# Patient Record
Sex: Male | Born: 1985 | Race: White | Hispanic: No | Marital: Married | Smoking: Current some day smoker
Health system: Southern US, Community
[De-identification: ages and names within clinical notes are randomized; demographics above are authoritative.]

## PROBLEM LIST (undated history)

## (undated) DIAGNOSIS — I10 Essential (primary) hypertension: Secondary | ICD-10-CM

## (undated) HISTORY — DX: Essential (primary) hypertension: I10

---

## 2007-10-10 ENCOUNTER — Emergency Department: Payer: Self-pay | Admitting: Unknown Physician Specialty

## 2012-03-24 ENCOUNTER — Ambulatory Visit: Payer: Self-pay | Admitting: Internal Medicine

## 2012-03-25 ENCOUNTER — Emergency Department: Payer: Self-pay | Admitting: Emergency Medicine

## 2012-12-21 ENCOUNTER — Ambulatory Visit: Payer: Self-pay | Admitting: Family Medicine

## 2012-12-21 VITALS — BP 150/90 | HR 93 | Temp 98.1°F | Resp 16 | Ht 69.75 in | Wt 192.0 lb

## 2012-12-21 DIAGNOSIS — R030 Elevated blood-pressure reading, without diagnosis of hypertension: Secondary | ICD-10-CM

## 2012-12-21 DIAGNOSIS — Z720 Tobacco use: Secondary | ICD-10-CM

## 2012-12-21 NOTE — Patient Instructions (Signed)
Your blood pressure is satisfactory to do your test- however, if it is running higher than 120/ 80 on a consistent basis we need to consider starting BP treatment.  Please check your BP at a drug store a few times over the next month and let us (or the doctor of your choice) know if you are running high.   Cigarettes and caffeine can also increase your blood pressure- try to quit smoking!

## 2012-12-21 NOTE — Progress Notes (Signed)
[facility] [address], [place] [address] [phone]  Date:  12/21/2012   Name:  Chad Bryan   DOB:  [birth year]   MRN:  [id number]  PCP:  Elvina Sidle, MD    Chief Complaint: BLOOD PRESSURE   History of Present Illness:  Chad Bryan is a 27 y.o. very pleasant male patient who presents with the following:  He is here to recheck his BP today- he was going to do a work pre- hire test this am but was not allowed to participate due to HTN- his BP was 174/ 110 and rechecked at 180/ 120.  He was sent to [place] for evaluation prior to be allowed to complete his test.  He will be working at [employer] and will have to lift a 50 lb weight  He does have a past history of HTN and was on propranolol for a brief period in 2010.  However, he changed his diet and lost weight- he had a PE a couple of years ago and his BP looked ok per his report, but he cannot remember the exact number.  He does not generally check his BP or see doctors  He is not aware of any family history of HTN.    He had gatorade and coffee this am, and smoked cigarettes on the way to the test He perfoms P90- x at home without any difficulty, no CP or SOB even with vigorous exercise  There is no problem list on file for this patient.   No past medical history on file.  No past surgical history on file.  History  Substance Use Topics  . Smoking status: Current Every Day Smoker -- 0.50 packs/day for 2 years    Types: Cigarettes  . Smokeless tobacco: Not on file  . Alcohol Use: Yes     Comment: 2-3 times per week drink 1-2    No family history on file.  No Known Allergies  Medication list has been reviewed and updated.  No current outpatient prescriptions on file prior to visit.   No current facility-administered medications on file prior to visit.    Review of Systems:  As per HPI- otherwise negative.Marland Kitchen  Physical Examination: Filed Vitals:   12/21/12 1037  BP: 152/96   Pulse: 93  Temp: 98.1 F (36.7 C)  Resp: 16   Filed Vitals:   12/21/12 1037  Height: 5' 9.75" (1.772 m)  Weight: 192 lb (87.091 kg)   Body mass index is 27.74 kg/(m^2). Ideal Body Weight: Weight in (lb) to have BMI = 25: 172.6  Recheck BP 150/ 90 general: WDWN, NAD, Non-toxic, A & O x 3 HEENT: Atraumatic, Normocephalic. Neck supple. No masses, No LAD. Ears and Nose: No external deformity. CV: RRR, No M/G/R. No JVD. No thrill. No extra heart sounds. PULM: CTA B, no wheezes, crackles, rhonchi. No retractions. No resp. distress. No accessory muscle use. EXTR: No c/c/e NEURO Normal gait.  PSYCH: Normally interactive. Conversant. Not depressed or anxious appearing.  Calm demeanor.    Assessment and Plan: Elevated BP  Tobacco abuse  Per paperwork he needs to have BP less than 160/ 100 to complete the test- he meets this standard.  However, encouraged him to continue to monitor his BP, as he may truly have hypertension.  He agreed to check his BP and seek care it if continues to run much more than 120/ 80.  Encouraged him to DC tobacco as well Abbe Amsterdam, MD

## 2013-01-13 ENCOUNTER — Telehealth: Payer: Self-pay

## 2013-01-13 NOTE — Telephone Encounter (Signed)
Copy of CPE faxed to [phone]  attention  AMY G .   CBN: [id number] (H)

## 2013-01-15 NOTE — Telephone Encounter (Signed)
Pt is calling checking on status of request for a copy of his current cpe to be faxed   [phone] best number

## 2013-01-19 ENCOUNTER — Telehealth: Payer: Self-pay

## 2013-01-19 NOTE — Telephone Encounter (Signed)
Date of service 12/21/12 faxed to employer [employer] today.

## 2013-01-19 NOTE — Telephone Encounter (Signed)
PT STATES HE HAVE CALLED OVER 5 TIMES REQUESTING WE SEND HIS PE HE HAD DONE TO [facility] AND HE HAD ALREADY SIGNED A RELEASE ALONG WITH THE FAX NUMBER TO SEND IT TO. CAN'T UNDERSTAND WHAT THE PROBLEM IS, BUT WOULD LIKE A CALL BACK ASAP TO [phone]

## 2015-05-30 DIAGNOSIS — I10 Essential (primary) hypertension: Secondary | ICD-10-CM | POA: Insufficient documentation

## 2015-11-03 ENCOUNTER — Encounter: Payer: Self-pay | Admitting: Primary Care

## 2015-11-03 ENCOUNTER — Ambulatory Visit (INDEPENDENT_AMBULATORY_CARE_PROVIDER_SITE_OTHER): Payer: PRIVATE HEALTH INSURANCE | Admitting: Primary Care

## 2015-11-03 VITALS — BP 154/112 | HR 78 | Temp 97.8°F | Ht 72.0 in | Wt 220.4 lb

## 2015-11-03 DIAGNOSIS — Z720 Tobacco use: Secondary | ICD-10-CM | POA: Diagnosis not present

## 2015-11-03 DIAGNOSIS — I10 Essential (primary) hypertension: Secondary | ICD-10-CM

## 2015-11-03 DIAGNOSIS — F411 Generalized anxiety disorder: Secondary | ICD-10-CM

## 2015-11-03 MED ORDER — HYDROCHLOROTHIAZIDE 25 MG PO TABS: 25.0000 mg | ORAL_TABLET | Freq: Every day | ORAL | Status: DC

## 2015-11-03 MED ORDER — BUSPIRONE HCL 7.5 MG PO TABS: 7.5000 mg | ORAL_TABLET | Freq: Two times a day (BID) | ORAL | Status: DC

## 2015-11-03 NOTE — Progress Notes (Signed)
Pre visit review using our clinic review tool, if applicable. No additional management support is needed unless otherwise documented below in the visit note. 

## 2015-11-03 NOTE — Progress Notes (Signed)
   Subjective:    Patient ID: Chad Bryan, male    DOB: 06/19/1986, 30 y.o.   MRN: [id number]  HPI  Chad Bryan is a  30 year old male who presents today to establish care and discuss the problems mentioned below. Will obtain old records. His last physical was in June 2016.  1) Elevated Blood Pressure Reading: History of in 2014 with documented reading of 150/90 at Urgent Care. Elevated blood pressure in clinic today at 154/112. Endorses history of elevated readings recently and has strong FH of HTN. Denies chest pain, dizziness, SOB.  2) Tobacco Abuse: Currently cigarette smoker during the weekends only and chewing tobacco during weekdays. He's smoked for the past 5 years.  3) Generalized Anxiety Disorder: Diagnosed last summer during his physical. He is currently managed on Buspar 7.5 mg BID. He feels well managed on this medication and is requesting a refill.   Review of Systems  Constitutional: Negative for unexpected weight change.  HENT: Negative for rhinorrhea.   Respiratory: Negative for cough and shortness of breath.   Cardiovascular: Negative for chest pain.  Gastrointestinal: Negative for diarrhea and constipation.  Genitourinary: Negative for difficulty urinating.  Musculoskeletal: Negative for myalgias and arthralgias.  Skin: Negative for rash.  Neurological: Positive for headaches. Negative for dizziness and numbness.  Psychiatric/Behavioral:       Currently treated for anxiety, denies concerns for anxiety.        Past Medical History  Diagnosis Date  . Essential hypertension     Social History   Social History  . Marital Status: Married    Spouse Name: N/A  . Number of Children: N/A  . Years of Education: N/A   Occupational History  .     Social History Main Topics  . Smoking status: Current Every Day Smoker -- 0.50 packs/day for 2 years    Types: Cigarettes  . Smokeless tobacco: Not on file  . Alcohol Use: 0.0 oz/week    0 Standard drinks or  equivalent per week     Comment: 2-3 times per week drink 1-2  . Drug Use: No  . Sexual Activity: Not on file     Comment: number of sex partners in the last 12 months  1   Other Topics Concern  . Not on file   Social History Narrative   Married.   No children.   Works for [employer].   Enjoys golfing, sports    No past surgical history on file.  Family History  Problem Relation Age of Onset  . Hypertension Mother   . Ovarian cancer Mother     No Known Allergies  No current outpatient prescriptions on file prior to visit.   No current facility-administered medications on file prior to visit.    BP 154/112 mmHg  Pulse 78  Temp(Src) 97.8 F (36.6 C) (Oral)  Ht 6' (1.829 m)  Wt 220 lb 6.4 oz (99.973 kg)  BMI 29.89 kg/m2  SpO2 98%    Objective:   Physical Exam  Constitutional: He is oriented to person, place, and time. He appears well-nourished.  Cardiovascular: Normal rate and regular rhythm.   Pulmonary/Chest: Effort normal and breath sounds normal.  Neurological: He is alert and oriented to person, place, and time.  Skin: Skin is warm and dry.  Psychiatric: He has a normal mood and affect.          Assessment & Plan:

## 2015-11-03 NOTE — Patient Instructions (Signed)
Refills were sent to [facility] [place] for Bus[place] tablets.  Start Hydrochlorothiazide 25 mg tablets for high blood pressure. Take 1 tablet by mouth every morning.  Follow up in 2 weeks for re-evaluation of blood pressure.  Please schedule a physical with me in June 2016. You may also schedule a lab only appointment 3-4 days prior. We will discuss your lab results in detail during your physical.  It was a pleasure to meet you today! Please don't hesitate to call me with any questions. Welcome to [facility]!

## 2015-11-04 DIAGNOSIS — F411 Generalized anxiety disorder: Secondary | ICD-10-CM | POA: Insufficient documentation

## 2015-11-04 NOTE — Assessment & Plan Note (Signed)
Diagnosed last summer, currently managed on Buspar 7.5 mg BID. Feels well managed at this dose. Refill provided.

## 2015-11-04 NOTE — Assessment & Plan Note (Signed)
Smokes cigarettes on weekends only, chewing tobacco during week days. Not ready to quit. Offered my assistance when ready. Discussed long term effects of tobacco abuse and HTN.

## 2015-11-04 NOTE — Assessment & Plan Note (Signed)
Elevated in clinic today, documented elevated readings in past. Strong FH of HTN, never any treatment, current smoker. Initiated HCTZ 25 mg today. Discussed possible side effects.  Follow up in 2 weeks for re-evaluation.

## 2015-11-17 ENCOUNTER — Ambulatory Visit (INDEPENDENT_AMBULATORY_CARE_PROVIDER_SITE_OTHER): Payer: PRIVATE HEALTH INSURANCE | Admitting: Primary Care

## 2015-11-17 VITALS — BP 124/92 | HR 79 | Temp 98.7°F | Ht 72.0 in | Wt 218.4 lb

## 2015-11-17 DIAGNOSIS — I10 Essential (primary) hypertension: Secondary | ICD-10-CM | POA: Diagnosis not present

## 2015-11-17 LAB — BASIC METABOLIC PANEL
BUN: 11 mg/dL (ref 6–23)
CO2: 29 mEq/L (ref 19–32)
Calcium: 9.5 mg/dL (ref 8.4–10.5)
Chloride: 102 mEq/L (ref 96–112)
Creatinine, Ser: 1.01 mg/dL (ref 0.40–1.50)
GFR: 91 mL/min (ref >60.00)
GLUCOSE: 90 mg/dL (ref 70–99)
Potassium: 3.7 mEq/L (ref 3.5–5.1)
SODIUM: 139 meq/L (ref 135–145)

## 2015-11-17 NOTE — Assessment & Plan Note (Signed)
Improved but not yet at goal. Discussed importance of healthy diet and tobacco cessation. Will closely monitor and hold off adding medication at this point. Will have him monitor BP at home and send me readings. No chest pain, SOB, dizziness. BMP pending today.

## 2015-11-17 NOTE — Progress Notes (Signed)
   Subjective:    Patient ID: Chad Bryan, male    DOB: 1986-09-26, 30 y.o.   MRN: [id number]  HPI  Chad Bryan is a 30 year old male who presents today for follow up of hypertension. He was evaluated as a new patient on 11/03/15 and noted to be hypertensive in the clinic that day at 154/112 and then 150/90 in a previous visit at Urgent Care. He was initiated on HCTZ 25 mg last visit.  Since his last visit his BP is improve, but not quite at goal. Denies headches, chest pain, dizziness, lower extremities. He has not checked his blood pressure at home.   BP Readings from Last 3 Encounters:  11/17/15 124/92  11/03/15 154/112  11/04/13 138/92     Review of Systems  Respiratory: Negative for shortness of breath.   Cardiovascular: Negative for chest pain and leg swelling.  Neurological: Negative for dizziness and headaches.       Past Medical History  Diagnosis Date  . Essential hypertension     Social History   Social History  . Marital Status: Married    Spouse Name: N/A  . Number of Children: N/A  . Years of Education: N/A   Occupational History  .     Social History Main Topics  . Smoking status: Current Every Day Smoker -- 0.50 packs/day for 2 years    Types: Cigarettes  . Smokeless tobacco: Not on file  . Alcohol Use: 0.0 oz/week    0 Standard drinks or equivalent per week     Comment: 2-3 times per week drink 1-2  . Drug Use: No  . Sexual Activity: Not on file     Comment: number of sex partners in the last 12 months  1   Other Topics Concern  . Not on file   Social History Narrative   Married.   No children.   Works for [employer].   Enjoys golfing, sports    No past surgical history on file.  Family History  Problem Relation Age of Onset  . Hypertension Mother   . Ovarian cancer Mother     No Known Allergies  Current Outpatient Prescriptions on File Prior to Visit  Medication Sig Dispense Refill  . busPIRone (BUSPAR) 7.5 MG tablet  Take 1 tablet (7.5 mg total) by mouth 2 (two) times daily. 60 tablet 5  . hydrochlorothiazide (HYDRODIURIL) 25 MG tablet Take 1 tablet (25 mg total) by mouth daily. 30 tablet 3   No current facility-administered medications on file prior to visit.    BP 124/92 mmHg  Pulse 79  Temp(Src) 98.7 F (37.1 C) (Oral)  Ht 6' (1.829 m)  Wt 218 lb 6.4 oz (99.066 kg)  BMI 29.61 kg/m2  SpO2 97%    Objective:   Physical Exam  Constitutional: He appears well-nourished.  Cardiovascular: Normal rate and regular rhythm.   Pulmonary/Chest: Effort normal and breath sounds normal.  Skin: Skin is warm and dry.          Assessment & Plan:

## 2015-11-17 NOTE — Patient Instructions (Signed)
Complete lab work prior to leaving today. I will notify you of your results once received.   Continue Hydrochlorothiazide 25 mg for blood pressure. Check your blood pressure 1-2 times weekly after 30 minutes of resting. Please call me if your readings get about 140/90 consistently.  Follow up in June for your annual physical as discussed.  It was a pleasure to see you today!

## 2015-11-17 NOTE — Progress Notes (Signed)
Pre visit review using our clinic review tool, if applicable. No additional management support is needed unless otherwise documented below in the visit note. 

## 2016-02-20 ENCOUNTER — Other Ambulatory Visit: Payer: Self-pay

## 2016-02-20 DIAGNOSIS — I10 Essential (primary) hypertension: Secondary | ICD-10-CM

## 2016-02-20 MED ORDER — HYDROCHLOROTHIAZIDE 25 MG PO TABS
25.0000 mg | ORAL_TABLET | Freq: Every day | ORAL | Status: DC
Start: 2016-02-20 — End: 2016-04-04

## 2016-02-20 NOTE — Telephone Encounter (Signed)
Pt request refill HCT[facility] to [facility] [address]. [facility] and [facility] has no available [facility]ills. Refill # 30 X1. Pt notified done.Pt has CPX on 04/04/16.

## 2016-04-03 ENCOUNTER — Other Ambulatory Visit: Payer: Self-pay | Admitting: Primary Care

## 2016-04-04 ENCOUNTER — Encounter: Payer: Self-pay | Admitting: Primary Care

## 2016-04-04 ENCOUNTER — Ambulatory Visit (INDEPENDENT_AMBULATORY_CARE_PROVIDER_SITE_OTHER): Payer: 59 | Admitting: Primary Care

## 2016-04-04 VITALS — BP 138/94 | HR 71 | Temp 97.9°F | Ht 72.0 in | Wt 223.0 lb

## 2016-04-04 DIAGNOSIS — Z0000 Encounter for general adult medical examination without abnormal findings: Secondary | ICD-10-CM | POA: Diagnosis not present

## 2016-04-04 DIAGNOSIS — F411 Generalized anxiety disorder: Secondary | ICD-10-CM | POA: Diagnosis not present

## 2016-04-04 DIAGNOSIS — Z23 Encounter for immunization: Secondary | ICD-10-CM

## 2016-04-04 DIAGNOSIS — I10 Essential (primary) hypertension: Secondary | ICD-10-CM | POA: Diagnosis not present

## 2016-04-04 LAB — LIPID PANEL
CHOLESTEROL: 272 mg/dL — AB (ref 0–200)
HDL: 51.3 mg/dL (ref >39.00)
LDL Cholesterol: 189 mg/dL — ABNORMAL HIGH (ref 0–99)
NonHDL: 220.67
Total CHOL/HDL Ratio: 5
Triglycerides: 156 mg/dL — ABNORMAL HIGH (ref 0.0–149.0)
VLDL: 31.2 mg/dL (ref 0.0–40.0)

## 2016-04-04 LAB — COMPREHENSIVE METABOLIC PANEL
ALBUMIN: 4.5 g/dL (ref 3.5–5.2)
ALK PHOS: 74 U/L (ref 39–117)
ALT: 64 U/L — ABNORMAL HIGH (ref 0–53)
AST: 34 U/L (ref 0–37)
BUN: 11 mg/dL (ref 6–23)
CO2: 26 mEq/L (ref 19–32)
Calcium: 9.4 mg/dL (ref 8.4–10.5)
Chloride: 104 mEq/L (ref 96–112)
Creatinine, Ser: 1.01 mg/dL (ref 0.40–1.50)
GFR: 90.78 mL/min (ref >60.00)
Glucose, Bld: 101 mg/dL — ABNORMAL HIGH (ref 70–99)
Potassium: 3.6 mEq/L (ref 3.5–5.1)
Sodium: 139 mEq/L (ref 135–145)
TOTAL PROTEIN: 8 g/dL (ref 6.0–8.3)
Total Bilirubin: 0.5 mg/dL (ref 0.2–1.2)

## 2016-04-04 MED ORDER — HYDROCHLOROTHIAZIDE 25 MG PO TABS: 25.0000 mg | ORAL_TABLET | Freq: Every day | ORAL | Status: DC

## 2016-04-04 NOTE — Addendum Note (Signed)
Addended by: Tawnya CrookSAMBATH, Aixa Corsello on: 04/04/2016 09:36 AM   Modules accepted: Kipp BroodSmartSet

## 2016-04-04 NOTE — Patient Instructions (Signed)
Complete lab work prior to leaving today. I will notify you of your results once received.   I sent refills of your medication to your pharmacy.  Follow up in 1 year for repeat physical or sooner if needed.  It was a pleasure to see you today!

## 2016-04-04 NOTE — Assessment & Plan Note (Signed)
[place] the beach [place], has not had BP meds in several days. Refills provided.

## 2016-04-04 NOTE — Addendum Note (Signed)
Addended by: Tawnya CrookSAMBATH, Sherrol Vicars on: 04/04/2016 09:32 AM   Modules accepted: Orders, SmartSet

## 2016-04-04 NOTE — Assessment & Plan Note (Signed)
Stable and well managed on Buspar. Continue same.

## 2016-04-04 NOTE — Progress Notes (Signed)
Pre visit review using our clinic review tool, if applicable. No additional management support is needed unless otherwise documented below in the visit note. 

## 2016-04-04 NOTE — Assessment & Plan Note (Signed)
Tdap due and provided today.  Poor diet and does not exercise. Discussed the importance of a healthy diet and regular exercise in order for weight loss and to reduce risk of other medical diseases. Labs pending. Exam unremarkable. Follow up in 1 year for repeat physical.

## 2016-04-04 NOTE — Progress Notes (Signed)
Subjective:    Patient ID: Chad Bryan, male    DOB: 01/10/1986, 30 y.o.   MRN: [id number]  HPI  Mr. Chad Bryan is a 30 year old male who presents today for complete physical.  Immunizations: -Tetanus: Unsure. Due today. -Influenza: Did not complete last season.   Diet: Endorses a fair diet. Breakfast: Fruit, yogurt Lunch: Fast food Dinner: Home cooked meals (salads, fruit, meat, veggies) Snacks: Junk Desserts: None Beverages: Water, Gatorade  Exercise: He does not currently exercise Eye exam: Completed 3 years ago, no changes in vision Dental exam: Completed in December 2016   Review of Systems  Constitutional: Negative for unexpected weight change.  HENT: Negative for rhinorrhea.   Respiratory: Negative for cough and shortness of breath.   Cardiovascular: Negative for chest pain.  Gastrointestinal: Negative for diarrhea and constipation.  Genitourinary: Negative for difficulty urinating.  Musculoskeletal: Negative for myalgias and arthralgias.  Skin: Negative for rash.  Allergic/Immunologic: Positive for environmental allergies.  Neurological: Negative for dizziness, numbness and headaches.  Psychiatric/Behavioral:       Feels well managed on Buspar.       Past Medical History  Diagnosis Date  . Essential hypertension      Social History   Social History  . Marital Status: Married    Spouse Name: N/A  . Number of Children: N/A  . Years of Education: N/A   Occupational History  .     Social History Main Topics  . Smoking status: Current Every Day Smoker -- 0.50 packs/day for 2 years    Types: Cigarettes  . Smokeless tobacco: Not on file  . Alcohol Use: 0.0 oz/week    0 Standard drinks or equivalent per week     Comment: 2-3 times per week drink 1-2  . Drug Use: No  . Sexual Activity: Not on file     Comment: number of sex partners in the last 12 months  1   Other Topics Concern  . Not on file   Social History Narrative   Married.   No  children.   Works for [employer].   Enjoys golfing, sports    No past surgical history on file.  Family History  Problem Relation Age of Onset  . Hypertension Mother   . Ovarian cancer Mother     No Known Allergies  Current Outpatient Prescriptions on File Prior to Visit  Medication Sig Dispense Refill  . busPIRone (BUSPAR) 7.5 MG tablet Take 1 tablet (7.5 mg total) by mouth 2 (two) times daily. 60 tablet 5   No current facility-administered medications on file prior to visit.    BP 138/94 mmHg  Pulse 71  Temp(Src) 97.9 F (36.6 C) (Oral)  Ht 6' (1.829 m)  Wt 223 lb (101.152 kg)  BMI 30.24 kg/m2  SpO2 96%    Objective:   Physical Exam  Constitutional: He is oriented to person, place, and time. He appears well-nourished.  HENT:  Right Ear: Tympanic membrane and ear canal normal.  Left Ear: Tympanic membrane and ear canal normal.  Nose: Nose normal. Right sinus exhibits no maxillary sinus tenderness and no frontal sinus tenderness. Left sinus exhibits no maxillary sinus tenderness and no frontal sinus tenderness.  Mouth/Throat: Oropharynx is clear and moist.  Eyes: Conjunctivae and EOM are normal. Pupils are equal, round, and reactive to light.  Neck: Neck supple. Carotid bruit is not present. No thyromegaly present.  Cardiovascular: Normal rate, regular rhythm and normal heart sounds.   Pulmonary/Chest: Effort  normal and breath sounds normal. He has no wheezes. He has no rales.  Abdominal: Soft. Bowel sounds are normal. There is no tenderness.  Musculoskeletal: Normal range of motion.  Neurological: He is alert and oriented to person, place, and time. He has normal reflexes. No cranial nerve deficit.  Skin: Skin is warm and dry.  Psychiatric: He has a normal mood and affect.          Assessment & Plan:

## 2016-04-05 ENCOUNTER — Encounter: Payer: Self-pay | Admitting: *Deleted

## 2016-08-25 ENCOUNTER — Other Ambulatory Visit: Payer: Self-pay | Admitting: Primary Care

## 2016-08-25 DIAGNOSIS — F411 Generalized anxiety disorder: Secondary | ICD-10-CM

## 2016-09-20 ENCOUNTER — Ambulatory Visit (INDEPENDENT_AMBULATORY_CARE_PROVIDER_SITE_OTHER): Payer: 59 | Admitting: Primary Care

## 2016-09-20 ENCOUNTER — Encounter: Payer: Self-pay | Admitting: Primary Care

## 2016-09-20 DIAGNOSIS — F411 Generalized anxiety disorder: Secondary | ICD-10-CM | POA: Diagnosis not present

## 2016-09-20 DIAGNOSIS — I10 Essential (primary) hypertension: Secondary | ICD-10-CM | POA: Diagnosis not present

## 2016-09-20 MED ORDER — BUSPIRONE HCL 7.5 MG PO TABS: 7.5000 mg | ORAL_TABLET | Freq: Two times a day (BID) | ORAL | 2 refills | Status: DC

## 2016-09-20 MED ORDER — HYDROCHLOROTHIAZIDE 25 MG PO TABS: 25.0000 mg | ORAL_TABLET | Freq: Every day | ORAL | 2 refills | Status: DC

## 2016-09-20 NOTE — Assessment & Plan Note (Signed)
Stable in the office today, home readings seem to be above goal. Discussed to check readings while resting at home, rather than in the middle of his work day. Refill of HCTZ sent, BMP due in June 2018.

## 2016-09-20 NOTE — Patient Instructions (Signed)
Please schedule a physical with me in June 2018. You may also schedule a lab only appointment 3-4 days prior. We will discuss your lab results in detail during your physical.  I sent refills for Buspar and HCTZ to your pharmacy.  It was a pleasure to see you today!

## 2016-09-20 NOTE — Progress Notes (Signed)
Pre visit review using our clinic review tool, if applicable. No additional management support is needed unless otherwise documented below in the visit note. 

## 2016-09-20 NOTE — Assessment & Plan Note (Signed)
Stable on Buspar.  No SI/HI, anxiety. Feels well managed. Refill provided today.

## 2016-09-20 NOTE — Progress Notes (Signed)
   Subjective:    Patient ID: Chad Bryan, male    DOB: 04/12/1986, [age] y.o.   MRN: [id number]  HPI  Mr. Chad Bryan is a 30 year old male who presents today for medication refill and follow up.  1) Generalized Anxiety Disorder: Currently managed on Buspar 7.5 mg BID. Diagnosed in Summer of 2016. He denies SI/HI upset stomach, nausea, anxiety. He feels well managed on this medication at this dose. He is needing a refill today.  2) Essential Hypertension: Currently managed on HCTZ 25 mg. He's checking his BP several times weekly which is running 140/90's. He's checking his BP during the middle of his work day for which he is not resting. He denies chest pain, shortness of breath, dizziness, visual changes, headaches. He is requesting a refill of his HCTZ today.  Review of Systems  Constitutional: Negative for fatigue.  Eyes: Negative for visual disturbance.  Respiratory: Negative for shortness of breath.   Cardiovascular: Negative for chest pain and leg swelling.  Neurological: Negative for headaches.  Psychiatric/Behavioral: Negative for suicidal ideas. The patient is not nervous/anxious.        Past Medical History:  Diagnosis Date  . Essential hypertension      Social History   Social History  . Marital status: Married    Spouse name: N/A  . Number of children: N/A  . Years of education: N/A   Occupational History  .  [employer]   Social History Main Topics  . Smoking status: Current Some Day Smoker    Packs/day: 0.50    Years: 2.00    Types: Cigarettes  . Smokeless tobacco: Current User  . Alcohol use 0.0 oz/week     Comment: 2-3 times per week drink 1-2  . Drug use: No  . Sexual activity: Not on file     Comment: number of sex partners in the last 12 months  1   Other Topics Concern  . Not on file   Social History Narrative   Married.   No children.   Works for [employer].   Enjoys golfing, sports    No past surgical history on file.  Family History    Problem Relation Age of Onset  . Hypertension Mother   . Ovarian cancer Mother     No Known Allergies  No current outpatient prescriptions on file prior to visit.   No current facility-administered medications on file prior to visit.     BP 138/86   Pulse 77   Temp 98 F (36.7 C) (Oral)   Ht 6' (1.829 m)   Wt 214 lb 1.9 oz (97.1 kg)   SpO2 98%   BMI 29.04 kg/m    Objective:   Physical Exam  Constitutional: He appears well-nourished.  Cardiovascular: Normal rate and regular rhythm.   Pulmonary/Chest: Effort normal and breath sounds normal.  Skin: Skin is warm and dry.  Psychiatric: He has a normal mood and affect.          Assessment & Plan:

## 2017-03-24 ENCOUNTER — Telehealth: Payer: Self-pay

## 2017-03-24 NOTE — Telephone Encounter (Signed)
Pt request refill on HCTZ and buspar; pt should have available refills thru 05/2017 and pt has CPX scheduled on 05/09/17. Pt voiced understanding and will ck with pharmacy.

## 2017-05-09 ENCOUNTER — Encounter: Payer: Self-pay | Admitting: Primary Care

## 2017-05-09 ENCOUNTER — Encounter: Payer: Self-pay | Admitting: *Deleted

## 2017-05-09 ENCOUNTER — Ambulatory Visit (INDEPENDENT_AMBULATORY_CARE_PROVIDER_SITE_OTHER): Payer: 59 | Admitting: Primary Care

## 2017-05-09 ENCOUNTER — Other Ambulatory Visit: Payer: Self-pay | Admitting: Primary Care

## 2017-05-09 VITALS — BP 124/84 | HR 96 | Temp 98.5°F | Ht 72.0 in | Wt 219.8 lb

## 2017-05-09 DIAGNOSIS — E785 Hyperlipidemia, unspecified: Secondary | ICD-10-CM | POA: Diagnosis not present

## 2017-05-09 DIAGNOSIS — R945 Abnormal results of liver function studies: Secondary | ICD-10-CM

## 2017-05-09 DIAGNOSIS — Z0000 Encounter for general adult medical examination without abnormal findings: Secondary | ICD-10-CM

## 2017-05-09 DIAGNOSIS — F411 Generalized anxiety disorder: Secondary | ICD-10-CM

## 2017-05-09 DIAGNOSIS — I10 Essential (primary) hypertension: Secondary | ICD-10-CM

## 2017-05-09 DIAGNOSIS — R7989 Other specified abnormal findings of blood chemistry: Secondary | ICD-10-CM

## 2017-05-09 LAB — LDL CHOLESTEROL, DIRECT: LDL DIRECT: 139 mg/dL

## 2017-05-09 LAB — LIPID PANEL
CHOLESTEROL: 224 mg/dL — AB (ref 0–200)
HDL: 41.8 mg/dL (ref >39.00)
NonHDL: 182.35
TRIGLYCERIDES: 301 mg/dL — AB (ref 0.0–149.0)
Total CHOL/HDL Ratio: 5
VLDL: 60.2 mg/dL — ABNORMAL HIGH (ref 0.0–40.0)

## 2017-05-09 LAB — COMPREHENSIVE METABOLIC PANEL
ALBUMIN: 4.4 g/dL (ref 3.5–5.2)
ALK PHOS: 76 U/L (ref 39–117)
ALT: 78 U/L — ABNORMAL HIGH (ref 0–53)
AST: 35 U/L (ref 0–37)
BUN: 10 mg/dL (ref 6–23)
CALCIUM: 9.5 mg/dL (ref 8.4–10.5)
CO2: 29 mEq/L (ref 19–32)
CREATININE: 0.99 mg/dL (ref 0.40–1.50)
Chloride: 104 mEq/L (ref 96–112)
GFR: 92.28 mL/min (ref >60.00)
Glucose, Bld: 93 mg/dL (ref 70–99)
POTASSIUM: 3.6 meq/L (ref 3.5–5.1)
SODIUM: 140 meq/L (ref 135–145)
TOTAL PROTEIN: 7.7 g/dL (ref 6.0–8.3)
Total Bilirubin: 0.6 mg/dL (ref 0.2–1.2)

## 2017-05-09 LAB — HEMOGLOBIN A1C: Hgb A1c MFr Bld: 5.5 % (ref 4.6–6.5)

## 2017-05-09 MED ORDER — BUSPIRONE HCL 7.5 MG PO TABS: 7.5000 mg | ORAL_TABLET | Freq: Two times a day (BID) | ORAL | 2 refills | Status: AC

## 2017-05-09 MED ORDER — HYDROCHLOROTHIAZIDE 25 MG PO TABS: 25.0000 mg | ORAL_TABLET | Freq: Every day | ORAL | 2 refills | Status: AC

## 2017-05-09 NOTE — Patient Instructions (Signed)
Complete lab work prior to leaving today. I will notify you of your results once received.   It's important to improve your diet by reducing consumption of fast food, fried food, processed snack foods, sugary drinks. Increase consumption of fresh vegetables and fruits, whole grains, water.  Ensure you are drinking 64 ounces of water daily.  Start exercising. You should be getting 150 minutes of moderate intensity exercise weekly.  Follow up in 1 year for your annual exam or sooner if needed.  It was a pleasure to see you today!  

## 2017-05-09 NOTE — Progress Notes (Signed)
Subjective:    Patient ID: Chad Bryan, male    DOB: 04/21/1986, 31 y.o.   MRN: [id number]  HPI  Chad Bryan is a 31 year old male who presents today for complete physical.  Immunizations: -Tetanus: Completed in 2017 -Influenza: Did not complete last season   Diet: He endorses a poor diet. Breakfast: Fruit, yogurt, egg Lunch: Fast food Dinner: Meat, vegetables, salads Snacks: Candy Desserts: None Beverages: Water, Gatorade, Beer/Wine  Exercise: Walks 2-3 times weekly in the evening. Eye exam: Completed several years ago. Dental exam: Completes semi-annually   Review of Systems  Constitutional: Negative for unexpected weight change.  HENT: Negative for rhinorrhea.   Respiratory: Negative for cough and shortness of breath.   Cardiovascular: Negative for chest pain.  Gastrointestinal: Negative for constipation and diarrhea.  Genitourinary: Negative for difficulty urinating.  Musculoskeletal: Negative for arthralgias and myalgias.  Skin: Negative for rash.  Allergic/Immunologic: Negative for environmental allergies.  Neurological: Negative for dizziness, numbness and headaches.  Psychiatric/Behavioral:       Feels well managed on Buspar       Past Medical History:  Diagnosis Date  . Essential hypertension      Social History   Social History  . Marital status: Married    Spouse name: N/A  . Number of children: N/A  . Years of education: N/A   Occupational History  .  [employer]   Social History Main Topics  . Smoking status: Current Some Day Smoker    Packs/day: 0.50    Years: 2.00    Types: Cigarettes  . Smokeless tobacco: Current User  . Alcohol use 0.0 oz/week     Comment: 2-3 times per week drink 1-2  . Drug use: No  . Sexual activity: Not on file     Comment: number of sex partners in the last 12 months  1   Other Topics Concern  . Not on file   Social History Narrative   Married.   No children.   Works [employer]   Enjoys  golfing, sports    No past surgical history on file.  Family History  Problem Relation Age of Onset  . Hypertension Mother   . Ovarian cancer Mother     No Known Allergies  No current outpatient prescriptions on file prior to visit.   No current facility-administered medications on file prior to visit.     BP 124/84   Pulse 96   Temp 98.5 F (36.9 C) (Oral)   Ht 6' (1.829 m)   Wt 219 lb 12.8 oz (99.7 kg)   SpO2 95%   BMI 29.81 kg/m    Objective:   Physical Exam  Constitutional: He is oriented to person, place, and time. He appears well-nourished.  HENT:  Right Ear: Tympanic membrane and ear canal normal.  Left Ear: Tympanic membrane and ear canal normal.  Nose: Nose normal. Right sinus exhibits no maxillary sinus tenderness and no frontal sinus tenderness. Left sinus exhibits no maxillary sinus tenderness and no frontal sinus tenderness.  Mouth/Throat: Oropharynx is clear and moist.  Eyes: Pupils are equal, round, and reactive to light. Conjunctivae and EOM are normal.  Neck: Neck supple. Carotid bruit is not present. No thyromegaly present.  Cardiovascular: Normal rate, regular rhythm and normal heart sounds.   Pulmonary/Chest: Effort normal and breath sounds normal. He has no wheezes. He has no rales.  Abdominal: Soft. Bowel sounds are normal. There is no tenderness.  Musculoskeletal: Normal range of motion.  Neurological: He is alert and oriented to person, place, and time. He has normal reflexes. No cranial nerve deficit.  Skin: Skin is warm and dry.  Psychiatric: He has a normal mood and affect.          Assessment & Plan:

## 2018-05-13 ENCOUNTER — Encounter: Payer: 59 | Admitting: Primary Care

## 2018-05-18 DIAGNOSIS — Z0289 Encounter for other administrative examinations: Secondary | ICD-10-CM

## 2019-07-08 ENCOUNTER — Ambulatory Visit: Payer: Self-pay | Admitting: Urology

## 2020-05-25 ENCOUNTER — Other Ambulatory Visit: Payer: Self-pay | Admitting: Internal Medicine

## 2020-05-25 DIAGNOSIS — R7401 Elevation of levels of liver transaminase levels: Secondary | ICD-10-CM

## 2020-05-30 ENCOUNTER — Ambulatory Visit
Admission: RE | Admit: 2020-05-30 | Discharge: 2020-05-30 | Disposition: A | Discharge status: Home / Self Care | Payer: No Typology Code available for payment source | Source: Ambulatory Visit | Attending: Internal Medicine | Admitting: Internal Medicine

## 2020-05-30 ENCOUNTER — Other Ambulatory Visit: Payer: Self-pay

## 2020-05-30 DIAGNOSIS — R7401 Elevation of levels of liver transaminase levels: Secondary | ICD-10-CM | POA: Diagnosis not present

## 2021-07-29 IMAGING — US US ABDOMEN LIMITED
1 series · 14 of 25 positions shown · non-contrast
Comparison: None.

CLINICAL DATA: Elevated LFTs

EXAM:
ULTRASOUND ABDOMEN LIMITED RIGHT UPPER QUADRANT

[Series 1: us abdomen limited ruq · 14 of 30 slices shown]
[im 1/30]
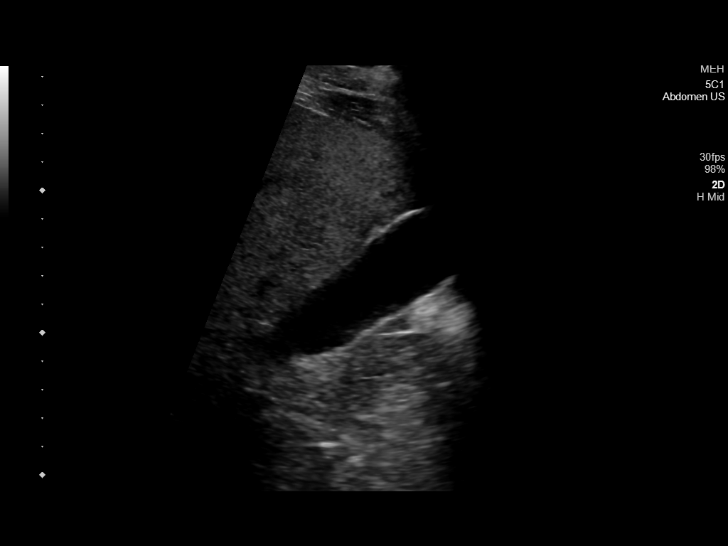
[im 3/30]
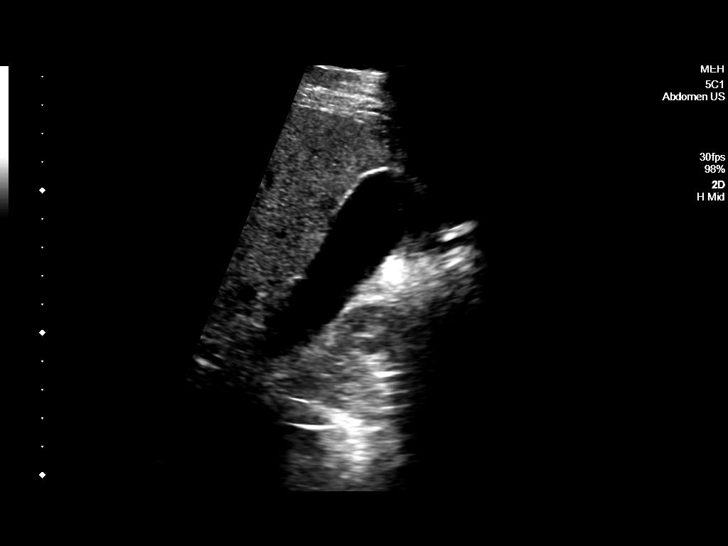
[im 5/30]
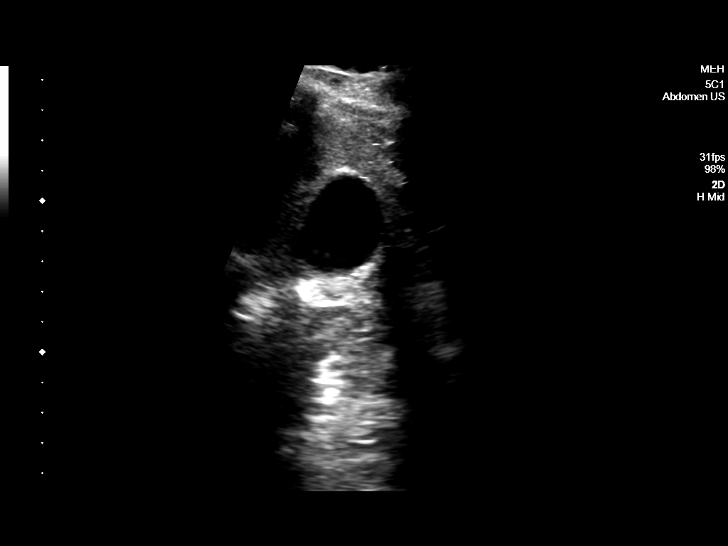
[im 8/30]
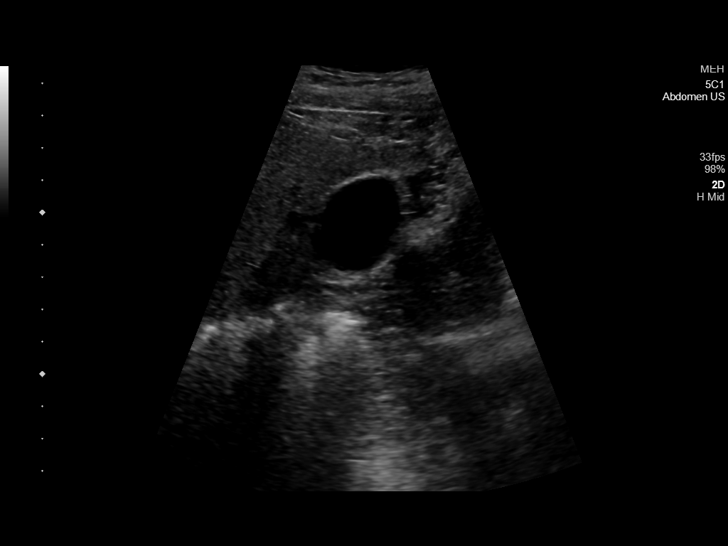
[im 10/30]
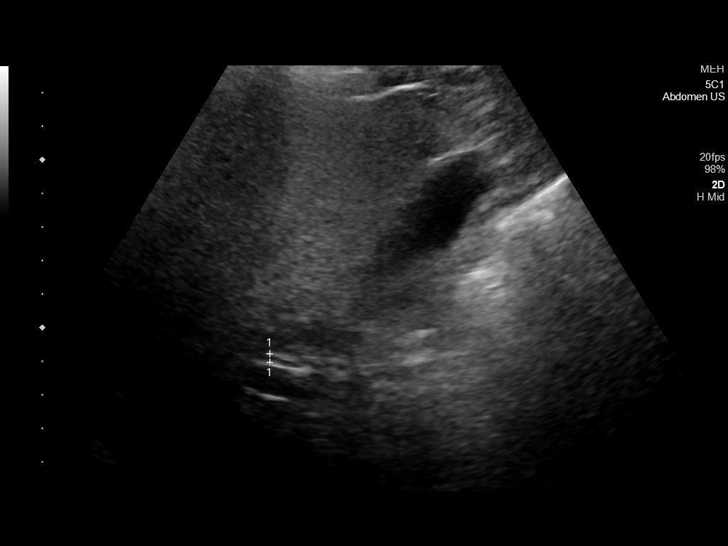
[im 11/30]
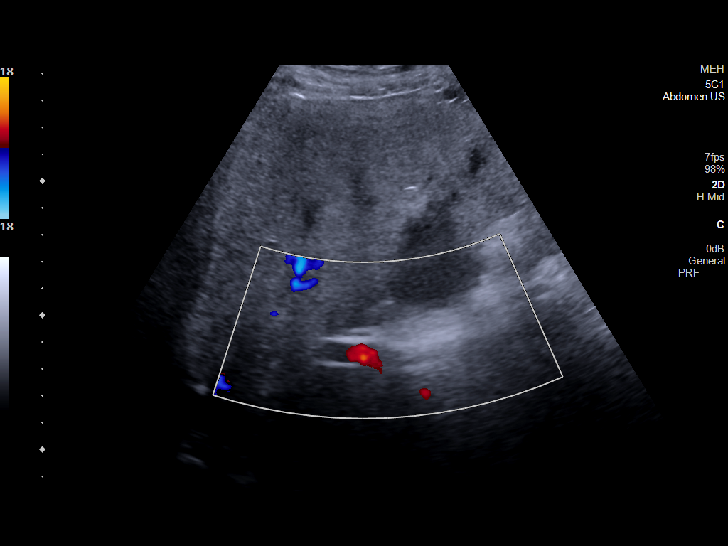
[im 14/30]
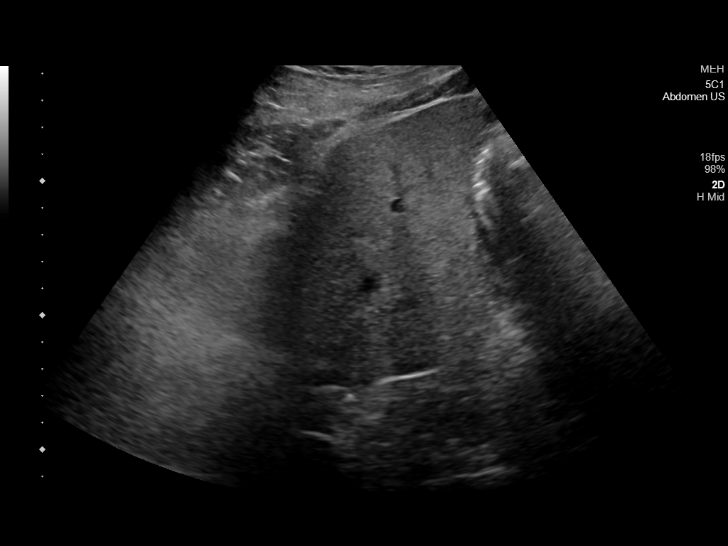
[im 16/30]
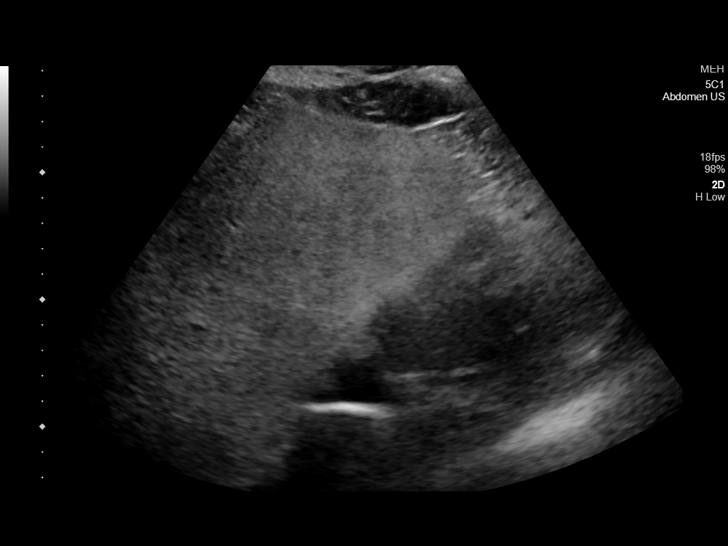
[im 19/30]
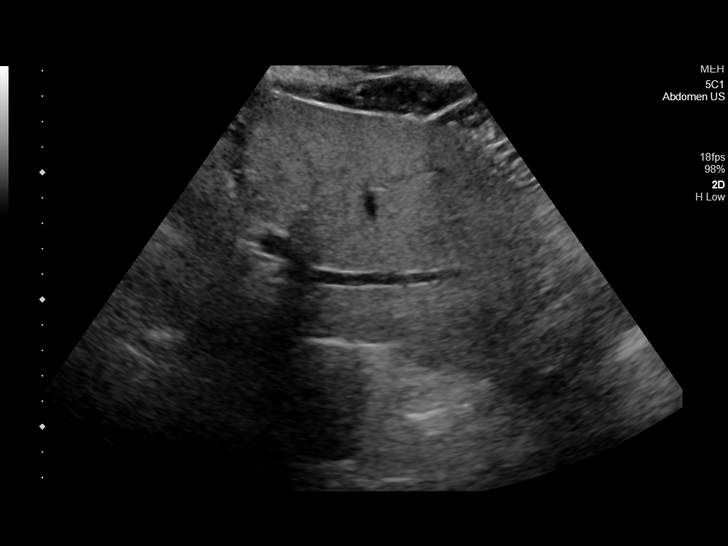
[im 20/30]
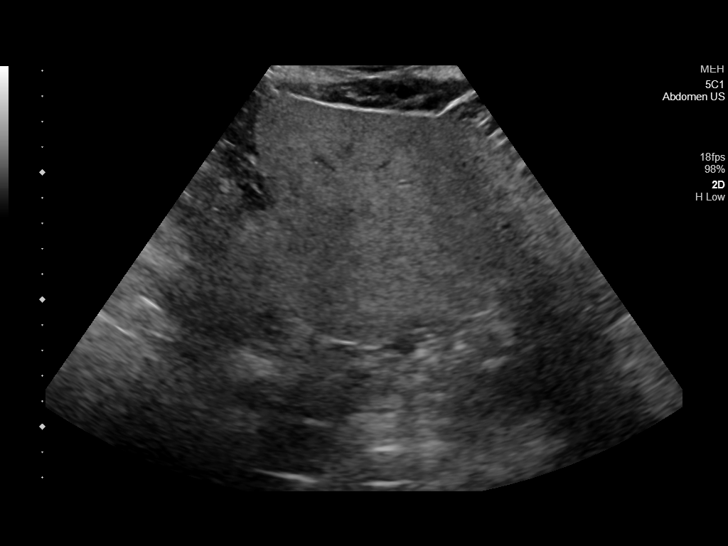
[im 22/30]
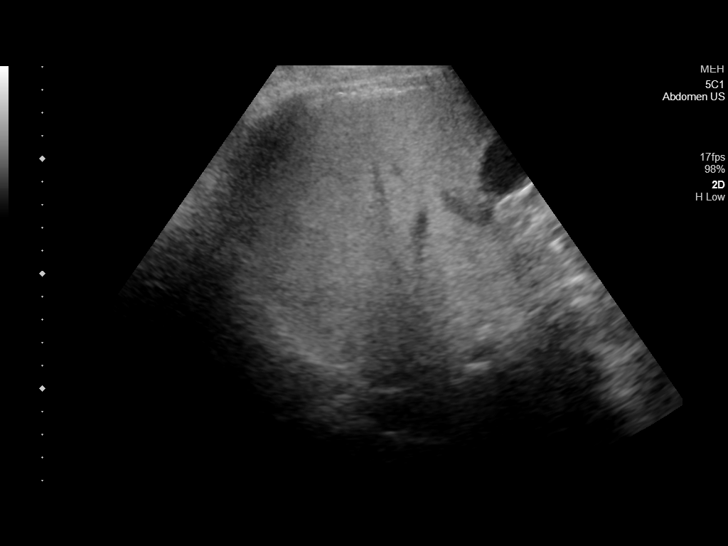
[im 25/30]
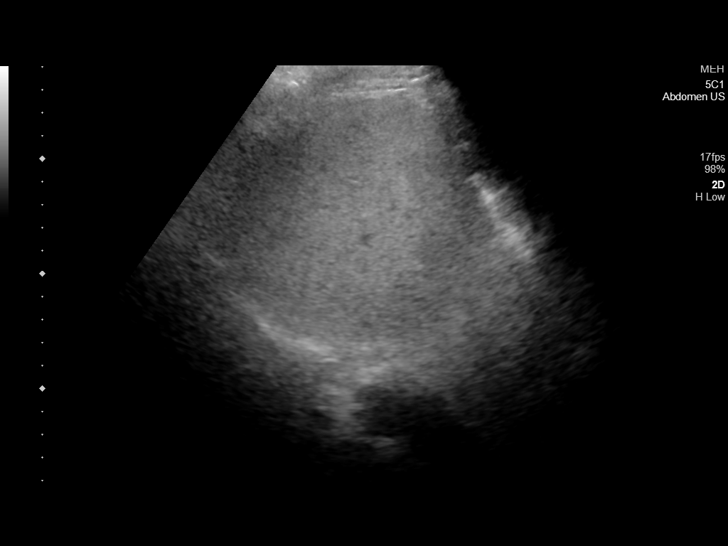
[im 27/30]
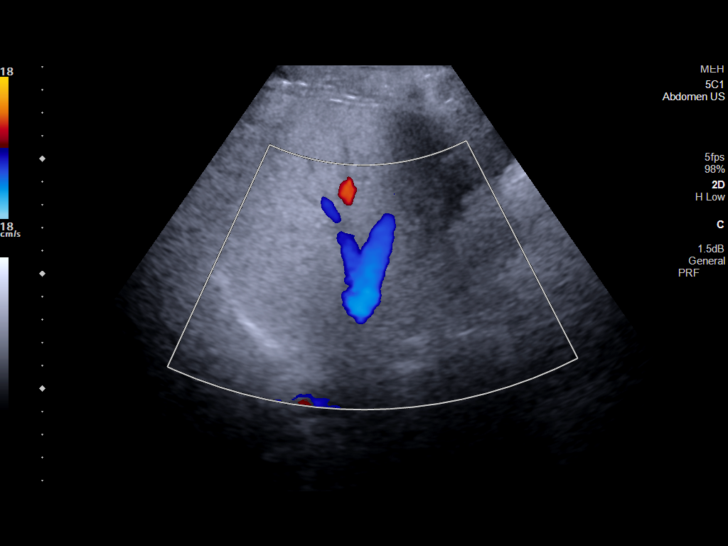
[im 30/30]
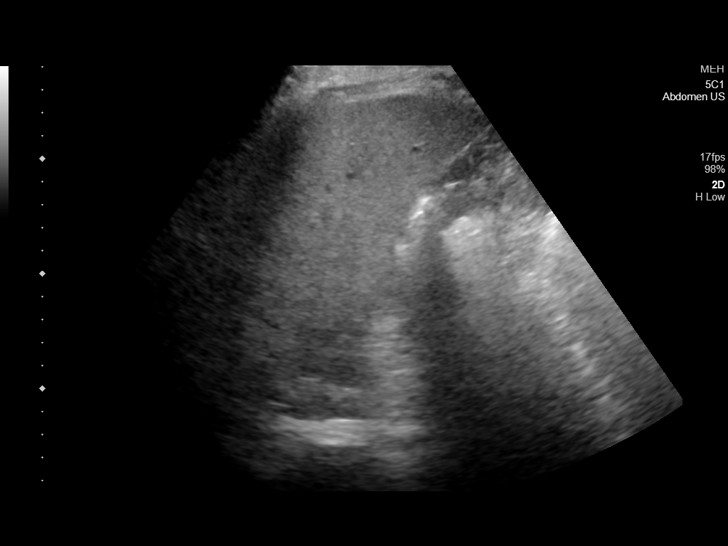

[14 of 25 positions shown; findings below may reference images not displayed]

FINDINGS: Gallbladder:

No gallstones or wall thickening visualized. No sonographic Murphy
sign noted by sonographer.

Common bile duct:

Diameter: 2.5 mm

Liver:

Increased echogenicity within the liver consistent with fatty
infiltration. No focal mass is noted. Portal vein is patent on color
Doppler imaging with normal direction of blood flow towards the
liver.

Other: None.
IMPRESSION: Fatty liver.

No other focal abnormality is noted.
